# Patient Record
Sex: Female | Born: 1970 | Race: Black or African American | Hispanic: No | Marital: Single | Smoking: Never smoker
Health system: Southern US, Community
[De-identification: ages and names within clinical notes are randomized; demographics above are authoritative.]

## PROBLEM LIST (undated history)

## (undated) DIAGNOSIS — I10 Essential (primary) hypertension: Secondary | ICD-10-CM

## (undated) DIAGNOSIS — F419 Anxiety disorder, unspecified: Secondary | ICD-10-CM

## (undated) DIAGNOSIS — F410 Panic disorder [episodic paroxysmal anxiety] without agoraphobia: Secondary | ICD-10-CM

---

## 2002-01-04 ENCOUNTER — Other Ambulatory Visit: Admission: RE | Admit: 2002-01-04 | Discharge: 2002-01-04 | Payer: Self-pay | Admitting: Family Medicine

## 2009-09-12 ENCOUNTER — Emergency Department (HOSPITAL_BASED_OUTPATIENT_CLINIC_OR_DEPARTMENT_OTHER): Admission: EM | Admit: 2009-09-12 | Discharge: 2009-09-12 | Payer: Self-pay | Admitting: Emergency Medicine

## 2010-02-10 ENCOUNTER — Encounter
Admission: RE | Admit: 2010-02-10 | Discharge: 2010-02-10 | Payer: Self-pay | Source: Home / Self Care | Attending: Obstetrics and Gynecology | Admitting: Obstetrics and Gynecology

## 2010-05-02 LAB — DIFFERENTIAL
Basophils Absolute: 0.1 10*3/uL (ref 0.0–0.1)
Basophils Relative: 1 % (ref 0–1)
Eosinophils Absolute: 0.1 K/uL (ref 0.0–0.7)
Eosinophils Relative: 1 % (ref 0–5)
Lymphocytes Relative: 53 % — ABNORMAL HIGH (ref 12–46)
Lymphs Abs: 4 K/uL (ref 0.7–4.0)
Monocytes Absolute: 0.7 10*3/uL (ref 0.1–1.0)
Monocytes Relative: 9 % (ref 3–12)
Neutro Abs: 2.7 K/uL (ref 1.7–7.7)
Neutrophils Relative %: 35 % — ABNORMAL LOW (ref 43–77)

## 2010-05-02 LAB — BASIC METABOLIC PANEL WITH GFR
BUN: 14 mg/dL (ref 6–23)
CO2: 26 meq/L (ref 19–32)
Calcium: 9.1 mg/dL (ref 8.4–10.5)
Chloride: 102 meq/L (ref 96–112)
GFR calc non Af Amer: >60 mL/min (ref >60)
Glucose, Bld: 133 mg/dL — ABNORMAL HIGH (ref 70–99)
Potassium: 3.1 meq/L — ABNORMAL LOW (ref 3.5–5.1)
Sodium: 140 meq/L (ref 135–145)

## 2010-05-02 LAB — CBC
HCT: 36.3 % (ref 36.0–46.0)
Hemoglobin: 12.4 g/dL (ref 12.0–15.0)
MCH: 31.3 pg (ref 26.0–34.0)
MCHC: 34.1 g/dL (ref 30.0–36.0)
MCV: 91.8 fL (ref 78.0–100.0)
Platelets: 270 10*3/uL (ref 150–400)
RBC: 3.96 MIL/uL (ref 3.87–5.11)
RDW: 11.2 % — ABNORMAL LOW (ref 11.5–15.5)
WBC: 7.6 K/uL (ref 4.0–10.5)

## 2010-05-02 LAB — T3, FREE: T3, Free: 2.9 pg/mL (ref 2.3–4.2)

## 2010-05-02 LAB — BASIC METABOLIC PANEL
Creatinine, Ser: 0.8 mg/dL (ref 0.4–1.2)
GFR calc Af Amer: >60 mL/min (ref >60)

## 2010-05-02 LAB — POCT CARDIAC MARKERS
CKMB, poc: <1 ng/mL — ABNORMAL LOW (ref 1.0–8.0)
Myoglobin, poc: 47 ng/mL (ref 12–200)
Troponin i, poc: <0.05 ng/mL (ref 0.00–0.09)

## 2011-02-04 DIAGNOSIS — Z0000 Encounter for general adult medical examination without abnormal findings: Secondary | ICD-10-CM | POA: Insufficient documentation

## 2011-04-20 ENCOUNTER — Other Ambulatory Visit: Payer: Self-pay | Admitting: Obstetrics and Gynecology

## 2011-04-20 DIAGNOSIS — N6312 Unspecified lump in the right breast, upper inner quadrant: Secondary | ICD-10-CM

## 2011-04-28 ENCOUNTER — Ambulatory Visit
Admission: RE | Admit: 2011-04-28 | Discharge: 2011-04-28 | Disposition: A | Discharge status: Home / Self Care | Payer: 59 | Source: Ambulatory Visit | Attending: Obstetrics and Gynecology | Admitting: Obstetrics and Gynecology

## 2011-04-28 DIAGNOSIS — N6312 Unspecified lump in the right breast, upper inner quadrant: Secondary | ICD-10-CM

## 2012-03-28 ENCOUNTER — Other Ambulatory Visit: Payer: Self-pay | Admitting: Obstetrics and Gynecology

## 2012-03-28 DIAGNOSIS — Z1231 Encounter for screening mammogram for malignant neoplasm of breast: Secondary | ICD-10-CM

## 2012-05-04 ENCOUNTER — Ambulatory Visit
Admission: RE | Admit: 2012-05-04 | Discharge: 2012-05-04 | Disposition: A | Discharge status: Home / Self Care | Payer: 59 | Source: Ambulatory Visit | Attending: Obstetrics and Gynecology | Admitting: Obstetrics and Gynecology

## 2012-05-04 DIAGNOSIS — Z1231 Encounter for screening mammogram for malignant neoplasm of breast: Secondary | ICD-10-CM

## 2012-09-16 ENCOUNTER — Emergency Department (HOSPITAL_BASED_OUTPATIENT_CLINIC_OR_DEPARTMENT_OTHER)
Admission: EM | Admit: 2012-09-16 | Discharge: 2012-09-16 | Disposition: A | Discharge status: Home / Self Care | Payer: 59 | Attending: Emergency Medicine | Admitting: Emergency Medicine

## 2012-09-16 ENCOUNTER — Encounter (HOSPITAL_BASED_OUTPATIENT_CLINIC_OR_DEPARTMENT_OTHER): Payer: Self-pay | Admitting: *Deleted

## 2012-09-16 DIAGNOSIS — I10 Essential (primary) hypertension: Secondary | ICD-10-CM | POA: Insufficient documentation

## 2012-09-16 DIAGNOSIS — M542 Cervicalgia: Secondary | ICD-10-CM | POA: Insufficient documentation

## 2012-09-16 HISTORY — DX: Essential (primary) hypertension: I10

## 2012-09-16 MED ORDER — CYCLOBENZAPRINE HCL 5 MG PO TABS: 5.0000 mg | ORAL_TABLET | Freq: Two times a day (BID) | ORAL | Status: DC | PRN

## 2012-09-16 NOTE — ED Provider Notes (Signed)
  CSN: [id number]     Arrival date & time 09/16/12  0711 History     First MD Initiated Contact with Patient 09/16/12 [phone]     Chief Complaint  Patient presents with  . Neck Pain    Patient is a 42 y.o. female presenting with neck pain. The history is provided by the patient.  Neck Pain Pain location:  L side Quality:  Aching Pain radiates to:  L shoulder Pain severity:  Moderate Pain is:  Same all the time Onset quality:  Gradual Duration:  1 day Timing:  Intermittent Progression:  Unchanged Chronicity:  New Relieved by:  NSAIDs Exacerbated by: movement  Associated symptoms: no chest pain, no fever, no headaches, no paresis, no visual change and no weakness   Risk factors: no hx of spinal trauma   pt reports she has had left sided neck pain.  She reports it will radiate into left shoulder No arm/leg weakness No HA No visual change No difficulty swallowing No fever No h/o spine surgery  Past Medical History  Diagnosis Date  . Hypertension    History  Substance Use Topics  . Smoking status: Never Smoker   . Smokeless tobacco: Not on file  . Alcohol Use: No   OB History   Grav Para Term Preterm Abortions TAB SAB Ect Mult Living                 Review of Systems  Constitutional: Negative for fever.  HENT: Positive for neck pain. Negative for trouble swallowing.   Respiratory: Negative for shortness of breath.   Cardiovascular: Negative for chest pain.  Gastrointestinal: Negative for vomiting.  Musculoskeletal: Negative for back pain.  Neurological: Negative for weakness and headaches.  All other systems reviewed and are negative.    Allergies  Review of patient's allergies indicates no known allergies.  Home Medications   Current Outpatient Rx  Name  Route  Sig  Dispense  Refill  . olmesartan-hydrochlorothiazide (BENICAR HCT) 20-12.5 MG per tablet   Oral   Take 1 tablet by mouth daily.         . cyclobenzaprine (FLEXERIL) 5 MG tablet   Oral   Take  1 tablet (5 mg total) by mouth 2 (two) times daily as needed for muscle spasms.   10 tablet   0    BP 112/76  Pulse 87  Temp(Src) 98.1 F (36.7 C) (Oral)  Resp 18  Ht 5\' 6"  (1.676 m)  Wt 110 lb (49.896 kg)  BMI 17.76 kg/m2  SpO2 100% Physical Exam CONSTITUTIONAL: Well developed/well nourished HEAD: Normocephalic/atraumatic EYES: EOMI/PERRL ENMT: Mucous membranes moist NECK: supple no meningeal signs, no bruits noted SPINE:entire spine nontender. Left cervical paraspinal tenderness.  No bruising/crepitance/stepoffs noted to spine CV: S1/S2 noted, no murmurs/rubs/gallops noted LUNGS: Lungs are clear to auscultation bilaterally, no apparent distress ABDOMEN: soft, nontender, no rebound or guarding GU:no cva tenderness NEURO: Pt is awake/alert, moves all extremitiesx4 Equal power with wrist flex/extension, elbow flex/extension, and equal power with shoulder abduction/adduction.  No focal sensory deficit is noted in either UE.   Equal biceps/brachioradial reflex in bilateral UE EXTREMITIES: pulses normal, full ROM SKIN: warm, color normal PSYCH: no abnormalities of mood noted  ED Course   Procedures  1. Neck pain on left side     MDM  Nursing notes including past medical history and social history reviewed and considered in documentation   Joya Gaskins, MD 09/16/12 [phone]

## 2012-09-16 NOTE — ED Notes (Signed)
Patient with left sided neck pain. Not sure if she injured it while at work or slept on it wrong.  Patient took ibuprofen at 3am and it helped a little with pain.

## 2013-04-27 ENCOUNTER — Other Ambulatory Visit: Payer: Self-pay

## 2013-04-27 DIAGNOSIS — Z1231 Encounter for screening mammogram for malignant neoplasm of breast: Secondary | ICD-10-CM

## 2013-05-17 ENCOUNTER — Ambulatory Visit
Admission: RE | Admit: 2013-05-17 | Discharge: 2013-05-17 | Disposition: A | Discharge status: Home / Self Care | Payer: 59 | Source: Ambulatory Visit

## 2013-05-17 DIAGNOSIS — Z1231 Encounter for screening mammogram for malignant neoplasm of breast: Secondary | ICD-10-CM

## 2013-07-31 DIAGNOSIS — F411 Generalized anxiety disorder: Secondary | ICD-10-CM | POA: Insufficient documentation

## 2013-09-13 DIAGNOSIS — K219 Gastro-esophageal reflux disease without esophagitis: Secondary | ICD-10-CM | POA: Insufficient documentation

## 2013-12-02 ENCOUNTER — Encounter (HOSPITAL_BASED_OUTPATIENT_CLINIC_OR_DEPARTMENT_OTHER): Payer: Self-pay | Admitting: Emergency Medicine

## 2013-12-02 ENCOUNTER — Emergency Department (HOSPITAL_BASED_OUTPATIENT_CLINIC_OR_DEPARTMENT_OTHER): Payer: 59

## 2013-12-02 ENCOUNTER — Emergency Department (HOSPITAL_BASED_OUTPATIENT_CLINIC_OR_DEPARTMENT_OTHER)
Admission: EM | Admit: 2013-12-02 | Discharge: 2013-12-02 | Disposition: A | Discharge status: Home / Self Care | Payer: 59 | Attending: Emergency Medicine | Admitting: Emergency Medicine

## 2013-12-02 DIAGNOSIS — R000 Tachycardia, unspecified: Secondary | ICD-10-CM | POA: Insufficient documentation

## 2013-12-02 DIAGNOSIS — R002 Palpitations: Secondary | ICD-10-CM | POA: Diagnosis not present

## 2013-12-02 DIAGNOSIS — Z79899 Other long term (current) drug therapy: Secondary | ICD-10-CM | POA: Diagnosis not present

## 2013-12-02 DIAGNOSIS — I10 Essential (primary) hypertension: Secondary | ICD-10-CM | POA: Diagnosis not present

## 2013-12-02 LAB — BASIC METABOLIC PANEL
ANION GAP: 14 (ref 5–15)
BUN: 10 mg/dL (ref 6–23)
CHLORIDE: 103 meq/L (ref 96–112)
CO2: 24 meq/L (ref 19–32)
Calcium: 9.4 mg/dL (ref 8.4–10.5)
Creatinine, Ser: 0.7 mg/dL (ref 0.50–1.10)
Glucose, Bld: 97 mg/dL (ref 70–99)
Potassium: 3.3 mEq/L — ABNORMAL LOW (ref 3.7–5.3)
Sodium: 141 mEq/L (ref 137–147)

## 2013-12-02 LAB — CBC
HEMATOCRIT: 32.2 % — AB (ref 36.0–46.0)
Hemoglobin: 11.1 g/dL — ABNORMAL LOW (ref 12.0–15.0)
MCH: 30.8 pg (ref 26.0–34.0)
MCHC: 34.5 g/dL (ref 30.0–36.0)
MCV: 89.4 fL (ref 78.0–100.0)
Platelets: 278 10*3/uL (ref 150–400)
RBC: 3.6 MIL/uL — AB (ref 3.87–5.11)
RDW: 11.1 % — ABNORMAL LOW (ref 11.5–15.5)
WBC: 5.7 10*3/uL (ref 4.0–10.5)

## 2013-12-02 LAB — D-DIMER, QUANTITATIVE (NOT AT ARMC)

## 2013-12-02 LAB — TROPONIN I

## 2013-12-02 NOTE — ED Provider Notes (Signed)
CSN: [id number]     Arrival date & time 12/02/13  1853 History   First MD Initiated Contact with Patient 12/02/13 2020     Chief Complaint  Patient presents with  . Irregular Heart Beat     (Consider location/radiation/quality/duration/timing/severity/associated sxs/prior Treatment) HPI Comments: This is a 43 year old female with a past medical history of hypertension who presents to the emergency department complaining of palpitations x1 day. Patient reports she was on a bus [place] into town around 9:00 AM when she felt "fluttering" in her chest. Sensation lasted a few minutes and subsided on its own, and a few hours later returned again. It has been intermittent throughout the day. Nothing in specific makes it come or go. Denies chest pain or shortness of breath. She does report she has GERD and tends to burp frequently, however states it is in no relation to palpitations. Denies nausea, vomiting, lightheadedness or dizziness. States she had a rapid heart rate in the past and was told she had anxiety. States she may have been anxious on the bus, however so significant anxiety. She recently had a stress test which was normal.  The history is provided by the patient.    Past Medical History  Diagnosis Date  . Hypertension    History reviewed. No pertinent past surgical history. No family history on file. History  Substance Use Topics  . Smoking status: Never Smoker   . Smokeless tobacco: Not on file  . Alcohol Use: No   OB History   Grav Para Term Preterm Abortions TAB SAB Ect Mult Living                 Review of Systems  10 Systems reviewed and are negative for acute change except as noted in the HPI.   Allergies  Review of patient's allergies indicates no known allergies.  Home Medications   Prior to Admission medications   Medication Sig Start Date End Date Taking? Authorizing Provider  ALPRAZolam Prudy Feeler(XANAX) 0.5 MG tablet Take 0.5 mg by mouth at bedtime as  needed for anxiety.   Yes Historical Provider, MD  cyclobenzaprine (FLEXERIL) 5 MG tablet Take 1 tablet (5 mg total) by mouth 2 (two) times daily as needed for muscle spasms. 09/16/12   Joya Gaskinsonald W Wickline, MD  olmesartan-hydrochlorothiazide (BENICAR HCT) 20-12.5 MG per tablet Take 1 tablet by mouth daily.    Historical Provider, MD   BP 123/73  Pulse 100  Temp(Src) 98.7 F (37.1 C) (Oral)  Resp 18  Ht 5\' 7"  (1.702 m)  Wt 119 lb (53.978 kg)  BMI 18.63 kg/m2  SpO2 100%  LMP 11/15/2013 Physical Exam  Nursing note and vitals reviewed. Constitutional: She is oriented to person, place, and time. She appears well-developed and well-nourished. No distress.  HENT:  Head: Normocephalic and atraumatic.  Mouth/Throat: Oropharynx is clear and moist.  Eyes: Conjunctivae and EOM are normal. Pupils are equal, round, and reactive to light.  Neck: Normal range of motion. Neck supple. No JVD present.  Cardiovascular: Regular rhythm, normal heart sounds and intact distal pulses.   Tachycardia. No extremity edema.  Pulmonary/Chest: Effort normal and breath sounds normal. No respiratory distress.  Abdominal: Soft. Bowel sounds are normal. There is no tenderness.  Musculoskeletal: Normal range of motion. She exhibits no edema.  Neurological: She is alert and oriented to person, place, and time. She has normal strength. No sensory deficit.  Speech fluent, goal oriented. Moves limbs without ataxia. Equal grip strength bilateral.  Skin: Skin  is warm and dry. She is not diaphoretic.  Psychiatric: She has a normal mood and affect. Her behavior is normal.    ED Course  Procedures (including critical care time) Labs Review Labs Reviewed  CBC - Abnormal; Notable for the following:    RBC 3.60 (*)    Hemoglobin 11.1 (*)    HCT 32.2 (*)    RDW 11.1 (*)    All other components within normal limits  BASIC METABOLIC PANEL - Abnormal; Notable for the following:    Potassium 3.3 (*)    All other components  within normal limits  TROPONIN I  D-DIMER, QUANTITATIVE    Imaging Review Dg Chest 2 View  12/02/2013   CLINICAL DATA:  Palpitations.  Duration of symptoms today.  EXAM: CHEST  2 VIEW  COMPARISON:  None.  FINDINGS: Artifact overlies the chest. Heart size is normal. Mediastinal shadows are normal. The lungs are clear. No effusions. No bony abnormalities. Pulmonary vascularity is normal.  IMPRESSION: Normal chest   Electronically Signed   By: Paulina FusiMark  Shogry M.D.   On: 12/02/2013 21:20     EKG Interpretation   Date/Time:  Sunday December 02 2013 19:02:43 EDT Ventricular Rate:  108 PR Interval:  142 QRS Duration: 78 QT Interval:  316 QTC Calculation: 423 R Axis:   64 Text Interpretation:  Sinus tachycardia Right atrial enlargement  Nonspecific ST and T wave abnormality Abnormal ECG agree. consistent with  old. Confirmed by Donnald GarrePfeiffer, MD, Lebron ConnersMarcy [phone]([id number]) on 12/02/2013 8:58:46 PM      MDM   Final diagnoses:  Palpitations   Patient presenting with palpitations. She is nontoxic appearing and in no apparent distress. Afebrile, tachycardic, vitals otherwise stable. No other symptoms present. Cannot explain tachycardia at this time. Cannot PERC negative. Labs pending. EKG sinus tachycardia with right atrial enlargement, nonspecific ST and T wave abnormality. 11:04 PM Troponin negative. D-dimer WNL. Low risk PE- no exogenous estrogen, no recent surgeries or long travel. CXR normal. Low risk HEART score II. HR 96-98. Palpitations may be from anxiety. I advised pt to f/u with her PCP regarding palpitations. Stable for d/c. Return precautions given. Patient states understanding of treatment care plan and is agreeable.  Kathrynn SpeedRobyn M Janet Humphreys, PA-C 12/02/13 2306

## 2013-12-02 NOTE — ED Notes (Signed)
Patient states that all day she has felt heart flutters or skipped beats. Denies any other symptoms.

## 2013-12-02 NOTE — ED Provider Notes (Signed)
Medical screening examination/treatment/procedure(s) were performed by non-physician practitioner and as supervising physician I was immediately available for consultation/collaboration.   EKG Interpretation   Date/Time:  Sunday December 02 2013 19:02:43 EDT Ventricular Rate:  108 PR Interval:  142 QRS Duration: 78 QT Interval:  316 QTC Calculation: 423 R Axis:   64 Text Interpretation:  Sinus tachycardia Right atrial enlargement  Nonspecific ST and T wave abnormality Abnormal ECG agree. consistent with  old. Confirmed by Donnald GarrePfeiffer, MD, Lebron ConnersMarcy [phone]([id number]) on 12/02/2013 8:58:46 PM       Janet BarretteMarcy Mirinda Monte, MD 12/02/13 2358

## 2013-12-02 NOTE — Discharge Instructions (Signed)
Follow up with your primary care doctor.  Palpitations A palpitation is the feeling that your heartbeat is irregular or is faster than normal. It may feel like your heart is fluttering or skipping a beat. Palpitations are usually not a serious problem. However, in some cases, you may need further medical evaluation. CAUSES  Palpitations can be caused by:  Smoking.  Caffeine or other stimulants, such as diet pills or energy drinks.  Alcohol.  Stress and anxiety.  Strenuous physical activity.  Fatigue.  Certain medicines.  Heart disease, especially if you have a history of irregular heart rhythms (arrhythmias), such as atrial fibrillation, atrial flutter, or supraventricular tachycardia.  An improperly working pacemaker or defibrillator. DIAGNOSIS  To find the cause of your palpitations, your health care provider will take your medical history and perform a physical exam. Your health care provider may also have you take a test called an ambulatory electrocardiogram (ECG). An ECG records your heartbeat patterns over a 24-hour period. You may also have other tests, such as:  Transthoracic echocardiogram (TTE). During echocardiography, sound waves are used to evaluate how blood flows through your heart.  Transesophageal echocardiogram (TEE).  Cardiac monitoring. This allows your health care provider to monitor your heart rate and rhythm in real time.  Holter monitor. This is a portable device that records your heartbeat and can help diagnose heart arrhythmias. It allows your health care provider to track your heart activity for several days, if needed.  Stress tests by exercise or by giving medicine that makes the heart beat faster. TREATMENT  Treatment of palpitations depends on the cause of your symptoms and can vary greatly. Most cases of palpitations do not require any treatment other than time, relaxation, and monitoring your symptoms. Other causes, such as atrial fibrillation,  atrial flutter, or supraventricular tachycardia, usually require further treatment. HOME CARE INSTRUCTIONS   Avoid:  Caffeinated coffee, tea, soft drinks, diet pills, and energy drinks.  Chocolate.  Alcohol.  Stop smoking if you smoke.  Reduce your stress and anxiety. Things that can help you relax include:  A method of controlling things in your body, such as your heartbeats, with your mind (biofeedback).  Yoga.  Meditation.  Physical activity such as swimming, jogging, or walking.  Get plenty of rest and sleep. SEEK MEDICAL CARE IF:   You continue to have a fast or irregular heartbeat beyond 24 hours.  Your palpitations occur more often. SEEK IMMEDIATE MEDICAL CARE IF:  You have chest pain or shortness of breath.  You have a severe headache.  You feel dizzy or you faint. MAKE SURE YOU:  Understand these instructions.  Will watch your condition.  Will get help right away if you are not doing well or get worse. Document Released: 01/30/2000 Document Revised: 02/06/2013 Document Reviewed: 04/02/2011 [facility] Patient Information 2015 [place], [place]. This information is not intended to replace advice given to you by your health care provider. Make sure you discuss any questions you have with your health care provider.

## 2013-12-19 ENCOUNTER — Encounter (HOSPITAL_BASED_OUTPATIENT_CLINIC_OR_DEPARTMENT_OTHER): Payer: Self-pay

## 2013-12-19 ENCOUNTER — Emergency Department (HOSPITAL_BASED_OUTPATIENT_CLINIC_OR_DEPARTMENT_OTHER)
Admission: EM | Admit: 2013-12-19 | Discharge: 2013-12-19 | Disposition: A | Discharge status: Home / Self Care | Payer: 59 | Attending: Emergency Medicine | Admitting: Emergency Medicine

## 2013-12-19 ENCOUNTER — Emergency Department (HOSPITAL_BASED_OUTPATIENT_CLINIC_OR_DEPARTMENT_OTHER): Payer: 59

## 2013-12-19 DIAGNOSIS — Z79899 Other long term (current) drug therapy: Secondary | ICD-10-CM | POA: Diagnosis not present

## 2013-12-19 DIAGNOSIS — I10 Essential (primary) hypertension: Secondary | ICD-10-CM | POA: Diagnosis not present

## 2013-12-19 DIAGNOSIS — R0789 Other chest pain: Secondary | ICD-10-CM | POA: Diagnosis not present

## 2013-12-19 DIAGNOSIS — F410 Panic disorder [episodic paroxysmal anxiety] without agoraphobia: Secondary | ICD-10-CM | POA: Insufficient documentation

## 2013-12-19 DIAGNOSIS — R079 Chest pain, unspecified: Secondary | ICD-10-CM | POA: Diagnosis present

## 2013-12-19 HISTORY — DX: Panic disorder (episodic paroxysmal anxiety): F41.0

## 2013-12-19 HISTORY — DX: Anxiety disorder, unspecified: F41.9

## 2013-12-19 LAB — BASIC METABOLIC PANEL
Anion gap: 11 (ref 5–15)
BUN: 14 mg/dL (ref 6–23)
CO2: 27 meq/L (ref 19–32)
CREATININE: 0.8 mg/dL (ref 0.50–1.10)
Calcium: 9.9 mg/dL (ref 8.4–10.5)
Chloride: 103 mEq/L (ref 96–112)
GFR calc Af Amer: >90 mL/min (ref >90)
GFR, EST NON AFRICAN AMERICAN: 90 mL/min — AB (ref >90)
GLUCOSE: 88 mg/dL (ref 70–99)
Potassium: 3.6 mEq/L — ABNORMAL LOW (ref 3.7–5.3)
SODIUM: 141 meq/L (ref 137–147)

## 2013-12-19 LAB — CBC WITH DIFFERENTIAL/PLATELET
BASOS ABS: 0 10*3/uL (ref 0.0–0.1)
Basophils Relative: 1 % (ref 0–1)
EOS ABS: 0.1 10*3/uL (ref 0.0–0.7)
EOS PCT: 1 % (ref 0–5)
HEMATOCRIT: 34.8 % — AB (ref 36.0–46.0)
Hemoglobin: 11.8 g/dL — ABNORMAL LOW (ref 12.0–15.0)
LYMPHS PCT: 39 % (ref 12–46)
Lymphs Abs: 2 10*3/uL (ref 0.7–4.0)
MCH: 31 pg (ref 26.0–34.0)
MCHC: 33.9 g/dL (ref 30.0–36.0)
MCV: 91.3 fL (ref 78.0–100.0)
MONO ABS: 0.5 10*3/uL (ref 0.1–1.0)
Monocytes Relative: 11 % (ref 3–12)
Neutro Abs: 2.5 10*3/uL (ref 1.7–7.7)
Neutrophils Relative %: 48 % (ref 43–77)
PLATELETS: 267 10*3/uL (ref 150–400)
RBC: 3.81 MIL/uL — ABNORMAL LOW (ref 3.87–5.11)
RDW: 11.4 % — AB (ref 11.5–15.5)
WBC: 5.1 10*3/uL (ref 4.0–10.5)

## 2013-12-19 LAB — D-DIMER, QUANTITATIVE (NOT AT ARMC)

## 2013-12-19 LAB — TROPONIN I: Troponin I: <0.3 ng/mL (ref <0.30)

## 2013-12-19 NOTE — ED Notes (Signed)
NP at bedside.

## 2013-12-19 NOTE — ED Notes (Signed)
C/o left upper chest, axilla pain started this am

## 2013-12-19 NOTE — ED Provider Notes (Signed)
CSN: [id number]     Arrival date & time 12/19/13  1539 History   First MD Initiated Contact with Patient 12/19/13 1555     Chief Complaint  Patient presents with  . Chest Pain     (Consider location/radiation/quality/duration/timing/severity/associated sxs/prior Treatment) HPI Comments: Pt comes in today with c/o pain in her left chest wall near her axilla. Pain is worse with breathing. Pain has been intermittent for the last 8 hours. No fever, cough, nausea, vomiting, diaphoresis or sob. States that she has history of similar symptoms and has had a stress test recently  That was normal. Pt states that she is not having pain at this time. She states that she felt sob on the way here but she states that it was because she became very anxious about the cause of the symptoms.  The history is provided by the patient. No language interpreter was used.    Past Medical History  Diagnosis Date  . Hypertension   . Anxiety   . Panic attack    History reviewed. No pertinent past surgical history. No family history on file. History  Substance Use Topics  . Smoking status: Never Smoker   . Smokeless tobacco: Not on file  . Alcohol Use: No   OB History    No data available     Review of Systems  All other systems reviewed and are negative.     Allergies  Review of patient's allergies indicates no known allergies.  Home Medications   Prior to Admission medications   Medication Sig Start Date End Date Taking? Authorizing Provider  ALPRAZolam Prudy Feeler(XANAX) 0.5 MG tablet Take 0.5 mg by mouth at bedtime as needed for anxiety.    Historical Provider, MD  cyclobenzaprine (FLEXERIL) 5 MG tablet Take 1 tablet (5 mg total) by mouth 2 (two) times daily as needed for muscle spasms. 09/16/12   Joya Gaskinsonald W Wickline, MD  olmesartan-hydrochlorothiazide (BENICAR HCT) 20-12.5 MG per tablet Take 1 tablet by mouth daily.    Historical Provider, MD   BP 122/77 mmHg  Pulse 95  Temp(Src) 98.6 F (37 C) (Oral)   Resp 22  SpO2 99%  LMP 11/29/2013 Physical Exam  Constitutional: She is oriented to person, place, and time. She appears well-developed and well-nourished.  HENT:  Head: Normocephalic and atraumatic.  Cardiovascular: Normal rate and regular rhythm.   Pulmonary/Chest: Effort normal and breath sounds normal.  Reproducible point tenderness in the left chest  Abdominal: Soft. Bowel sounds are normal. There is no tenderness.  Musculoskeletal: Normal range of motion.  Neurological: She is alert and oriented to person, place, and time.  Skin: Skin is warm and dry.  Psychiatric: She has a normal mood and affect.  Nursing note and vitals reviewed.   ED Course  Procedures (including critical care time) Labs Review Labs Reviewed  BASIC METABOLIC PANEL - Abnormal; Notable for the following:    Potassium 3.6 (*)    GFR calc non Af Amer 90 (*)    All other components within normal limits  CBC WITH DIFFERENTIAL - Abnormal; Notable for the following:    RBC 3.81 (*)    Hemoglobin 11.8 (*)    HCT 34.8 (*)    RDW 11.4 (*)    All other components within normal limits  TROPONIN I  D-DIMER, QUANTITATIVE    Imaging Review Dg Chest 2 View  12/19/2013   CLINICAL DATA:  Left-sided chest pain, tightness. Shortness of breath. History of hypertension.  EXAM: CHEST  2  VIEW  COMPARISON:  12/02/2013  FINDINGS: The heart size and mediastinal contours are within normal limits. Both lungs are clear. The visualized skeletal structures are unremarkable.  IMPRESSION: No active cardiopulmonary disease.   Electronically Signed   By: Charlett NoseKevin  Dover M.D.   On: 12/19/2013 16:24     EKG Interpretation   Date/Time:  Wednesday December 19 2013 15:49:43 EST Ventricular Rate:  94 PR Interval:  146 QRS Duration: 80 QT Interval:  368 QTC Calculation: 460 R Axis:   59 Text Interpretation:  Normal sinus rhythm Normal ECG No significant change  since last tracing Confirmed by YAO  MD, DAVID ([id number]) on 12/19/2013   5:42:04 PM      MDM   Final diagnoses:  Other chest pain   Doubt acs. tropinin negative. Discussed follow up with pcp to discuss gi issues being a possibility. Pt okay with plan     Teressa LowerVrinda Loyal Holzheimer, NP 12/19/13 1746  Richardean Canalavid H Yao, MD 12/19/13 [phone]

## 2013-12-19 NOTE — Discharge Instructions (Signed)
As discussed I would suggest you follow up with your doctor and discussed further follow up Chest Pain (Nonspecific) It is often hard to give a diagnosis for the cause of chest pain. There is always a chance that your pain could be related to something serious, such as a heart attack or a blood clot in the lungs. You need to follow up with your doctor. HOME CARE  If antibiotic medicine was given, take it as directed by your doctor. Finish the medicine even if you start to feel better.  For the next few days, avoid activities that bring on chest pain. Continue physical activities as told by your doctor.  Do not use any tobacco products. This includes cigarettes, chewing tobacco, and e-cigarettes.  Avoid drinking alcohol.  Only take medicine as told by your doctor.  Follow your doctor's suggestions for more testing if your chest pain does not go away.  Keep all doctor visits you made. GET HELP IF:  Your chest pain does not go away, even after treatment.  You have a rash with blisters on your chest.  You have a fever. GET HELP RIGHT AWAY IF:   You have more pain or pain that spreads to your arm, neck, jaw, back, or belly (abdomen).  You have shortness of breath.  You cough more than usual or cough up blood.  You have very bad back or belly pain.  You feel sick to your stomach (nauseous) or throw up (vomit).  You have very bad weakness.  You pass out (faint).  You have chills. This is an emergency. Do not wait to see if the problems will go away. Call your local emergency services (911 in [place] Do not drive yourself to the hospital. MAKE SURE YOU:   Understand these instructions.  Will watch your condition.  Will get help right away if you are not doing well or get worse. Document Released: 07/21/2007 Document Revised: 02/06/2013 Document Reviewed: 07/21/2007 [facility] Patient Information 2015 [place], [place]. This information is not intended to replace advice given to  you by your health care provider. Make sure you discuss any questions you have with your health care provider.

## 2013-12-20 DIAGNOSIS — J309 Allergic rhinitis, unspecified: Secondary | ICD-10-CM | POA: Insufficient documentation

## 2013-12-20 DIAGNOSIS — F4322 Adjustment disorder with anxiety: Secondary | ICD-10-CM | POA: Insufficient documentation

## 2013-12-20 DIAGNOSIS — I10 Essential (primary) hypertension: Secondary | ICD-10-CM | POA: Insufficient documentation

## 2014-03-06 DIAGNOSIS — Z9289 Personal history of other medical treatment: Secondary | ICD-10-CM | POA: Insufficient documentation

## 2014-03-07 ENCOUNTER — Other Ambulatory Visit: Payer: Self-pay

## 2014-03-07 DIAGNOSIS — Z1231 Encounter for screening mammogram for malignant neoplasm of breast: Secondary | ICD-10-CM

## 2014-05-20 ENCOUNTER — Ambulatory Visit: Payer: Self-pay

## 2014-06-21 ENCOUNTER — Ambulatory Visit: Payer: Self-pay

## 2014-07-22 ENCOUNTER — Ambulatory Visit
Admission: RE | Admit: 2014-07-22 | Discharge: 2014-07-22 | Disposition: A | Discharge status: Home / Self Care | Payer: 59 | Source: Ambulatory Visit

## 2014-07-22 DIAGNOSIS — Z1231 Encounter for screening mammogram for malignant neoplasm of breast: Secondary | ICD-10-CM

## 2015-05-09 ENCOUNTER — Other Ambulatory Visit: Payer: Self-pay

## 2015-05-09 DIAGNOSIS — Z1231 Encounter for screening mammogram for malignant neoplasm of breast: Secondary | ICD-10-CM

## 2015-09-15 ENCOUNTER — Ambulatory Visit
Admission: RE | Admit: 2015-09-15 | Discharge: 2015-09-15 | Disposition: A | Discharge status: Home / Self Care | Payer: BLUE CROSS/BLUE SHIELD | Source: Ambulatory Visit

## 2015-09-15 ENCOUNTER — Encounter: Payer: Self-pay | Admitting: Radiology

## 2015-09-15 DIAGNOSIS — Z1231 Encounter for screening mammogram for malignant neoplasm of breast: Secondary | ICD-10-CM

## 2016-07-13 ENCOUNTER — Other Ambulatory Visit: Payer: Self-pay | Admitting: Family Medicine

## 2016-07-13 DIAGNOSIS — Z1231 Encounter for screening mammogram for malignant neoplasm of breast: Secondary | ICD-10-CM

## 2016-09-15 ENCOUNTER — Ambulatory Visit
Admission: RE | Admit: 2016-09-15 | Discharge: 2016-09-15 | Disposition: A | Discharge status: Home / Self Care | Payer: BLUE CROSS/BLUE SHIELD | Source: Ambulatory Visit | Attending: Family Medicine | Admitting: Family Medicine

## 2016-09-15 ENCOUNTER — Encounter (INDEPENDENT_AMBULATORY_CARE_PROVIDER_SITE_OTHER): Payer: Self-pay

## 2016-09-15 DIAGNOSIS — Z1231 Encounter for screening mammogram for malignant neoplasm of breast: Secondary | ICD-10-CM

## 2017-02-22 DIAGNOSIS — J301 Allergic rhinitis due to pollen: Secondary | ICD-10-CM | POA: Insufficient documentation

## 2017-07-15 ENCOUNTER — Other Ambulatory Visit: Payer: Self-pay | Admitting: Family Medicine

## 2017-07-15 DIAGNOSIS — Z1231 Encounter for screening mammogram for malignant neoplasm of breast: Secondary | ICD-10-CM

## 2017-09-16 ENCOUNTER — Ambulatory Visit
Admission: RE | Admit: 2017-09-16 | Discharge: 2017-09-16 | Disposition: A | Discharge status: Home / Self Care | Payer: BLUE CROSS/BLUE SHIELD | Source: Ambulatory Visit | Attending: Family Medicine | Admitting: Family Medicine

## 2017-09-16 DIAGNOSIS — Z1231 Encounter for screening mammogram for malignant neoplasm of breast: Secondary | ICD-10-CM

## 2018-07-08 ENCOUNTER — Encounter (HOSPITAL_COMMUNITY): Payer: Self-pay | Admitting: Emergency Medicine

## 2018-07-08 ENCOUNTER — Other Ambulatory Visit: Payer: Self-pay

## 2018-07-08 ENCOUNTER — Emergency Department (HOSPITAL_COMMUNITY)
Admission: EM | Admit: 2018-07-08 | Discharge: 2018-07-08 | Disposition: A | Discharge status: Home / Self Care | Payer: BLUE CROSS/BLUE SHIELD | Attending: Emergency Medicine | Admitting: Emergency Medicine

## 2018-07-08 DIAGNOSIS — Z79899 Other long term (current) drug therapy: Secondary | ICD-10-CM | POA: Insufficient documentation

## 2018-07-08 DIAGNOSIS — R1013 Epigastric pain: Secondary | ICD-10-CM | POA: Diagnosis present

## 2018-07-08 DIAGNOSIS — R682 Dry mouth, unspecified: Secondary | ICD-10-CM | POA: Diagnosis not present

## 2018-07-08 DIAGNOSIS — R946 Abnormal results of thyroid function studies: Secondary | ICD-10-CM | POA: Insufficient documentation

## 2018-07-08 DIAGNOSIS — I10 Essential (primary) hypertension: Secondary | ICD-10-CM | POA: Diagnosis not present

## 2018-07-08 DIAGNOSIS — R7989 Other specified abnormal findings of blood chemistry: Secondary | ICD-10-CM

## 2018-07-08 DIAGNOSIS — K219 Gastro-esophageal reflux disease without esophagitis: Secondary | ICD-10-CM | POA: Diagnosis not present

## 2018-07-08 LAB — CBC
HCT: 38 % (ref 36.0–46.0)
Hemoglobin: 12.5 g/dL (ref 12.0–15.0)
MCH: 30.2 pg (ref 26.0–34.0)
MCHC: 32.9 g/dL (ref 30.0–36.0)
MCV: 91.8 fL (ref 80.0–100.0)
Platelets: 262 10*3/uL (ref 150–400)
RBC: 4.14 MIL/uL (ref 3.87–5.11)
RDW: 11.2 % — ABNORMAL LOW (ref 11.5–15.5)
WBC: 4.7 10*3/uL (ref 4.0–10.5)
nRBC: 0 % (ref 0.0–0.2)

## 2018-07-08 LAB — COMPREHENSIVE METABOLIC PANEL
ALT: 16 U/L (ref 0–44)
AST: 18 U/L (ref 15–41)
Albumin: 4.5 g/dL (ref 3.5–5.0)
Alkaline Phosphatase: 94 U/L (ref 38–126)
Anion gap: 8 (ref 5–15)
BUN: 18 mg/dL (ref 6–20)
CO2: 28 mmol/L (ref 22–32)
Calcium: 9.4 mg/dL (ref 8.9–10.3)
Chloride: 104 mmol/L (ref 98–111)
Creatinine, Ser: 0.88 mg/dL (ref 0.44–1.00)
GFR calc Af Amer: >60 mL/min (ref >60)
GFR calc non Af Amer: >60 mL/min (ref >60)
Glucose, Bld: 108 mg/dL — ABNORMAL HIGH (ref 70–99)
Potassium: 3.7 mmol/L (ref 3.5–5.1)
Sodium: 140 mmol/L (ref 135–145)
Total Bilirubin: 0.6 mg/dL (ref 0.3–1.2)
Total Protein: 8.5 g/dL — ABNORMAL HIGH (ref 6.5–8.1)

## 2018-07-08 LAB — LIPASE, BLOOD: Lipase: 29 U/L (ref 11–51)

## 2018-07-08 LAB — URINALYSIS, ROUTINE W REFLEX MICROSCOPIC
Bilirubin Urine: NEGATIVE
Glucose, UA: NEGATIVE mg/dL
Hgb urine dipstick: NEGATIVE
Ketones, ur: NEGATIVE mg/dL
Leukocytes,Ua: NEGATIVE
Nitrite: NEGATIVE
Protein, ur: NEGATIVE mg/dL
Specific Gravity, Urine: 1.006 (ref 1.005–1.030)
pH: 6 (ref 5.0–8.0)

## 2018-07-08 LAB — I-STAT BETA HCG BLOOD, ED (MC, WL, AP ONLY): I-stat hCG, quantitative: <5 m[IU]/mL (ref <5)

## 2018-07-08 LAB — TSH: TSH: 4.771 u[IU]/mL — ABNORMAL HIGH (ref 0.350–4.500)

## 2018-07-08 MED ORDER — SODIUM CHLORIDE 0.9% FLUSH: 3.0000 mL | Freq: Once | INTRAVENOUS | Status: DC

## 2018-07-08 MED ORDER — OMEPRAZOLE 20 MG PO CPDR: 20.0000 mg | DELAYED_RELEASE_CAPSULE | Freq: Every day | ORAL | 0 refills | Status: AC

## 2018-07-08 NOTE — ED Triage Notes (Signed)
Patient is feeling burning in her abdomen, dry mouth, pulse high, and blood pressure high. Patient states she does not feel good.

## 2018-07-08 NOTE — ED Provider Notes (Signed)
[facility]-EMERGENCY DEPT Provider Note   CSN: [id number] Arrival date & time: 07/08/18  0240    History   Chief Complaint Chief Complaint  Patient presents with  . Hypertension  . Abdominal Pain    HPI Janet Perez is a 48 y.o. female.     Patient presents to the emergency department with a chief complaint of multiple complaints.  She states that this week she has been having high blood pressure, dry mouth, symptoms of racing heart, but without palpitations.  He also reports that she has had some acid reflux and pain in her epigastrium.  She also states that she had a cramp in her foot earlier tonight, but this is resolved.  The history is provided by the patient. No language interpreter was used.    Past Medical History:  Diagnosis Date  . Anxiety   . Hypertension   . Panic attack     There are no active problems to display for this patient.   History reviewed. No pertinent surgical history.   OB History   No obstetric history on file.      Home Medications    Prior to Admission medications   Medication Sig Start Date End Date Taking? Authorizing Provider  ALPRAZolam Prudy Feeler(XANAX) 0.5 MG tablet Take 0.5 mg by mouth at bedtime as needed for anxiety.    [provider]  cyclobenzaprine (FLEXERIL) 5 MG tablet Take 1 tablet (5 mg total) by mouth 2 (two) times daily as needed for muscle spasms. 09/16/12   Zadie RhineWickline, Donald, MD  olmesartan-hydrochlorothiazide (BENICAR HCT) 20-12.5 MG per tablet Take 1 tablet by mouth daily.    [provider]    Family History Family History  Problem Relation Age of Onset  . Breast cancer Maternal Aunt     Social History Social History   Tobacco Use  . Smoking status: Never Smoker  . Smokeless tobacco: Never Used  Substance Use Topics  . Alcohol use: No  . Drug use: No     Allergies   Amoxicillin-pot clavulanate; Other; and Amoxicillin   Review of Systems Review of Systems  All  other systems reviewed and are negative.    Physical Exam Updated Vital Signs BP 133/85 (BP Location: Left Arm)   Pulse 98   Temp 99 F (37.2 C) (Oral)   Resp 15   Ht 5\' 9"  (1.753 m)   Wt 56.7 kg   LMP 11/29/2013   SpO2 100%   BMI 18.46 kg/m   Physical Exam Vitals signs and nursing note reviewed.  Constitutional:      General: She is not in acute distress.    Appearance: She is well-developed.  HENT:     Head: Normocephalic and atraumatic.     Mouth/Throat:     Comments: No palpable thyroid nodules Eyes:     Conjunctiva/sclera: Conjunctivae normal.  Neck:     Musculoskeletal: Neck supple.  Cardiovascular:     Rate and Rhythm: Normal rate and regular rhythm.     Heart sounds: No murmur.  Pulmonary:     Effort: Pulmonary effort is normal. No respiratory distress.     Breath sounds: Normal breath sounds.  Abdominal:     Palpations: Abdomen is soft.     Tenderness: There is no abdominal tenderness.  Skin:    General: Skin is warm and dry.  Neurological:     Mental Status: She is alert and oriented to person, place, and time.  Psychiatric:  Comments: Extremely anxious      ED Treatments / Results  Labs (all labs ordered are listed, but only abnormal results are displayed) Labs Reviewed  COMPREHENSIVE METABOLIC PANEL - Abnormal; Notable for the following components:      Result Value   Glucose, Bld 108 (*)    Total Protein 8.5 (*)    All other components within normal limits  CBC - Abnormal; Notable for the following components:   RDW 11.2 (*)    All other components within normal limits  URINALYSIS, ROUTINE W REFLEX MICROSCOPIC - Abnormal; Notable for the following components:   Color, Urine COLORLESS (*)    All other components within normal limits  TSH - Abnormal; Notable for the following components:   TSH 4.771 (*)    All other components within normal limits  LIPASE, BLOOD  I-STAT BETA HCG BLOOD, ED ([facility], [facility], [facility] ONLY)    EKG None  Radiology  No results found.  Procedures Procedures (including critical care time)  Medications Ordered in ED Medications  sodium chloride flush (NS) 0.9 % injection 3 mL (has no administration in time range)     Initial Impression / Assessment and Plan / ED Course  I have reviewed the triage vital signs and the nursing notes.  Pertinent labs & imaging results that were available during my care of the patient were reviewed by me and considered in my medical decision making (see chart for details).        Patient with many symptoms, likely all stemming from anxiety.  Check labs and TSH, but anticipate discharged home with primary care follow-up.  TSH is mildly elevated, will need follow-up with her primary care doctor.  Vital signs are stable in the emergency department.  She does have a history of acid reflux, and has some burning in her epigastrium, however she only takes Tums for this.  Will prescribe her omeprazole.  Patient very nervous about COVID-19, I do not feel the patient requires testing at this time emergently.  She is afebrile, does not have cough, has no shortness of breath, and has had no direct contact with anyone with COVID-19.   Final Clinical Impressions(s) / ED Diagnoses   Final diagnoses:  Gastroesophageal reflux disease, esophagitis presence not specified  Dry mouth  Elevated TSH    ED Discharge Orders    None       Roxy Horseman, PA-C 07/08/18 0556    Dione Booze, MD 07/08/18 [phone]

## 2018-07-08 NOTE — Discharge Instructions (Addendum)
Please follow-up with your doctor.   Your thyroid test was slightly elevated today.  You will need to have this rechecked by your doctor.  No emergent condition was found to explain your symptoms.

## 2018-07-11 DIAGNOSIS — F439 Reaction to severe stress, unspecified: Secondary | ICD-10-CM | POA: Insufficient documentation

## 2018-09-25 ENCOUNTER — Other Ambulatory Visit: Payer: Self-pay | Admitting: Family Medicine

## 2018-09-25 DIAGNOSIS — Z1231 Encounter for screening mammogram for malignant neoplasm of breast: Secondary | ICD-10-CM

## 2018-10-18 ENCOUNTER — Other Ambulatory Visit: Payer: Self-pay

## 2018-10-18 ENCOUNTER — Ambulatory Visit
Admission: RE | Admit: 2018-10-18 | Discharge: 2018-10-18 | Disposition: A | Discharge status: Home / Self Care | Payer: BC Managed Care – PPO | Source: Ambulatory Visit | Attending: Family Medicine | Admitting: Family Medicine

## 2018-10-18 DIAGNOSIS — Z1231 Encounter for screening mammogram for malignant neoplasm of breast: Secondary | ICD-10-CM

## 2019-10-18 ENCOUNTER — Other Ambulatory Visit: Payer: Self-pay | Admitting: Family Medicine

## 2019-10-18 DIAGNOSIS — Z1231 Encounter for screening mammogram for malignant neoplasm of breast: Secondary | ICD-10-CM

## 2019-10-26 ENCOUNTER — Ambulatory Visit: Payer: Self-pay | Admitting: Allergy

## 2019-11-01 ENCOUNTER — Other Ambulatory Visit: Payer: Self-pay

## 2019-11-01 ENCOUNTER — Ambulatory Visit: Payer: BC Managed Care – PPO

## 2019-11-01 ENCOUNTER — Ambulatory Visit
Admission: RE | Admit: 2019-11-01 | Discharge: 2019-11-01 | Disposition: A | Discharge status: Home / Self Care | Payer: Managed Care, Other (non HMO) | Source: Ambulatory Visit | Attending: Family Medicine | Admitting: Family Medicine

## 2019-11-01 DIAGNOSIS — Z1231 Encounter for screening mammogram for malignant neoplasm of breast: Secondary | ICD-10-CM

## 2019-11-02 ENCOUNTER — Ambulatory Visit: Payer: Self-pay | Admitting: Allergy

## 2019-11-28 ENCOUNTER — Ambulatory Visit: Payer: Self-pay | Admitting: Allergy and Immunology

## 2020-12-16 ENCOUNTER — Other Ambulatory Visit: Payer: Self-pay | Admitting: Family Medicine

## 2020-12-16 DIAGNOSIS — Z1231 Encounter for screening mammogram for malignant neoplasm of breast: Secondary | ICD-10-CM

## 2021-01-28 ENCOUNTER — Ambulatory Visit
Admission: RE | Admit: 2021-01-28 | Discharge: 2021-01-28 | Disposition: A | Discharge status: Home / Self Care | Payer: BC Managed Care – PPO | Source: Ambulatory Visit | Attending: Family Medicine | Admitting: Family Medicine

## 2021-01-28 ENCOUNTER — Ambulatory Visit: Payer: Managed Care, Other (non HMO)

## 2021-01-28 DIAGNOSIS — Z1231 Encounter for screening mammogram for malignant neoplasm of breast: Secondary | ICD-10-CM

## 2021-12-08 ENCOUNTER — Other Ambulatory Visit: Payer: Self-pay | Admitting: Family Medicine

## 2021-12-08 DIAGNOSIS — Z139 Encounter for screening, unspecified: Secondary | ICD-10-CM

## 2022-01-29 ENCOUNTER — Ambulatory Visit
Admission: RE | Admit: 2022-01-29 | Discharge: 2022-01-29 | Disposition: A | Discharge status: Home / Self Care | Payer: BC Managed Care – PPO | Source: Ambulatory Visit | Attending: Family Medicine | Admitting: Family Medicine

## 2022-01-29 DIAGNOSIS — Z139 Encounter for screening, unspecified: Secondary | ICD-10-CM

## 2022-02-10 IMAGING — MG MM DIGITAL SCREENING BILAT W/ TOMO AND CAD
8 series · 9 of 24 positions shown · non-contrast
Comparison: Previous exam(s).

CLINICAL DATA: Screening.

EXAM:
DIGITAL SCREENING BILATERAL MAMMOGRAM WITH TOMOSYNTHESIS AND CAD
TECHNIQUE: Bilateral screening digital craniocaudal and mediolateral oblique
mammograms were obtained. Bilateral screening digital breast
tomosynthesis was performed. The images were evaluated with
computer-aided detection.

[L MLO synth-2D]
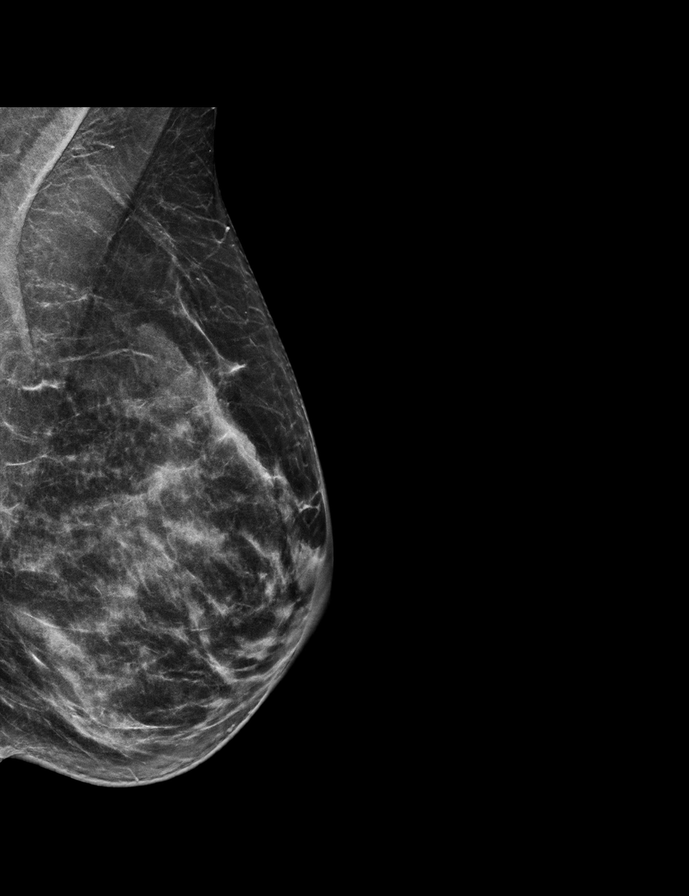

[L CC synth-2D]
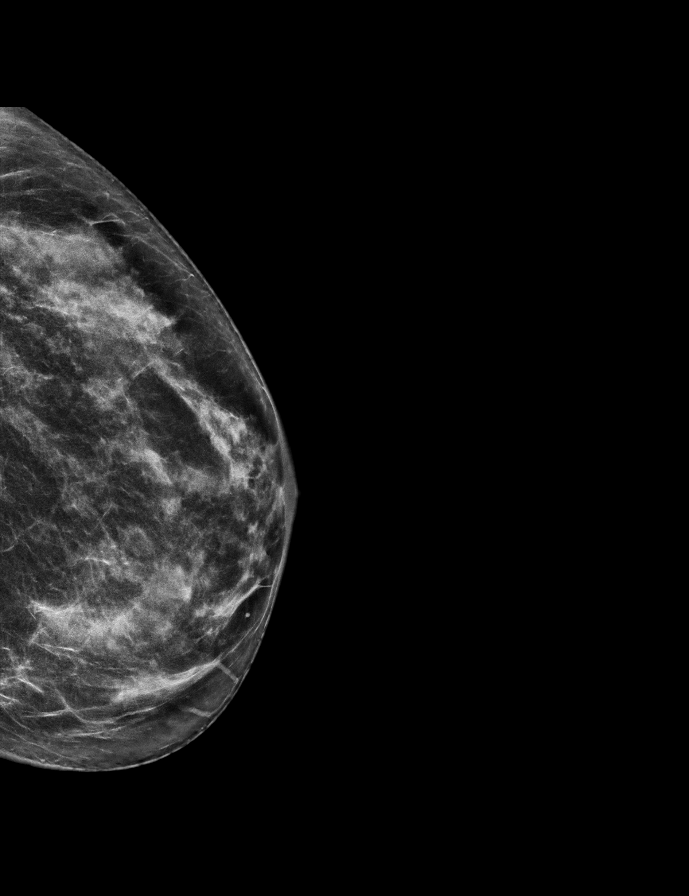

[R MLO synth-2D]
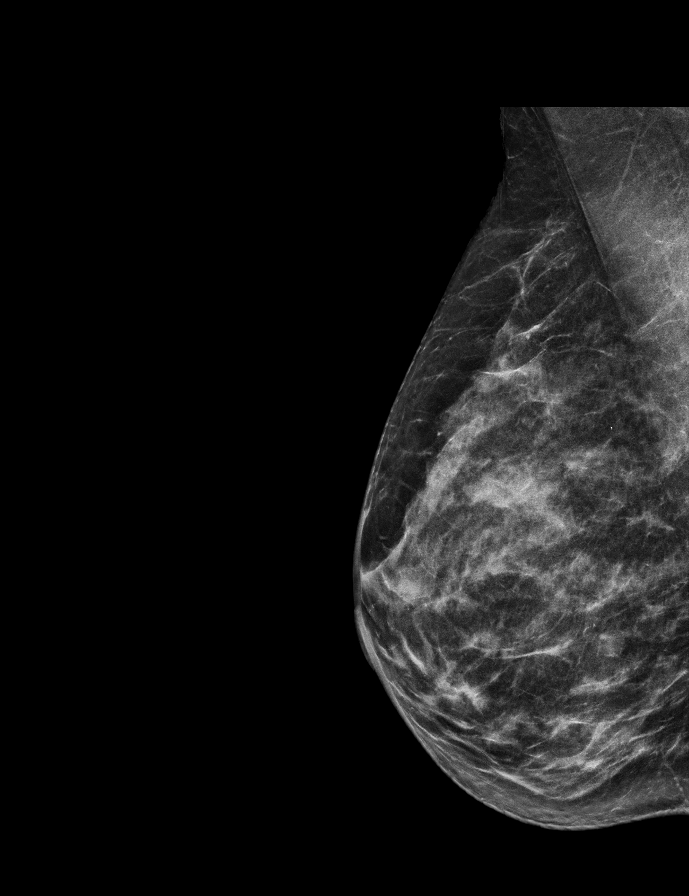

[R CC synth-2D]
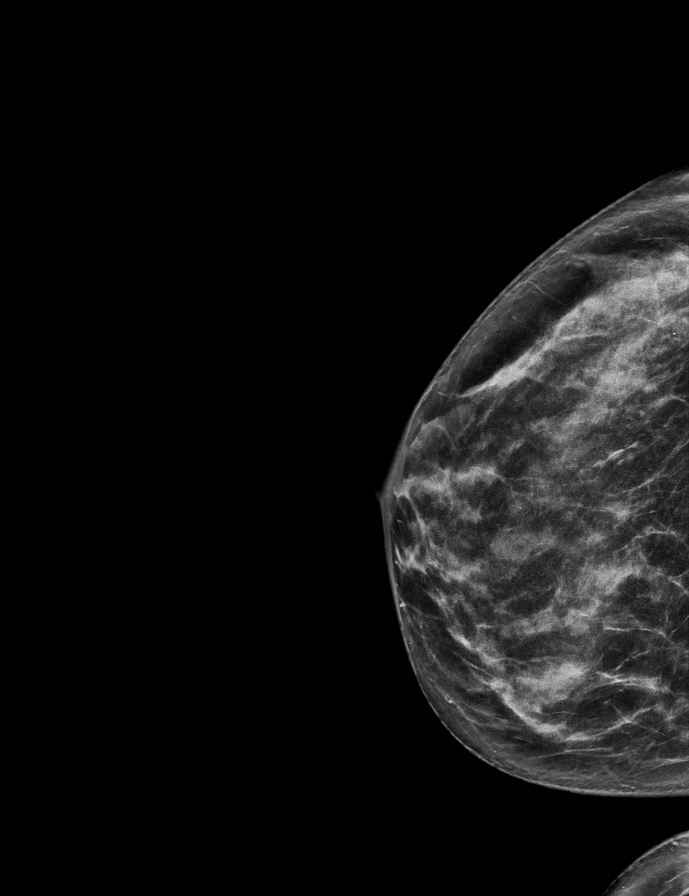

[R CC tomo · 2 of 59 frames shown]
[frame 20/59]
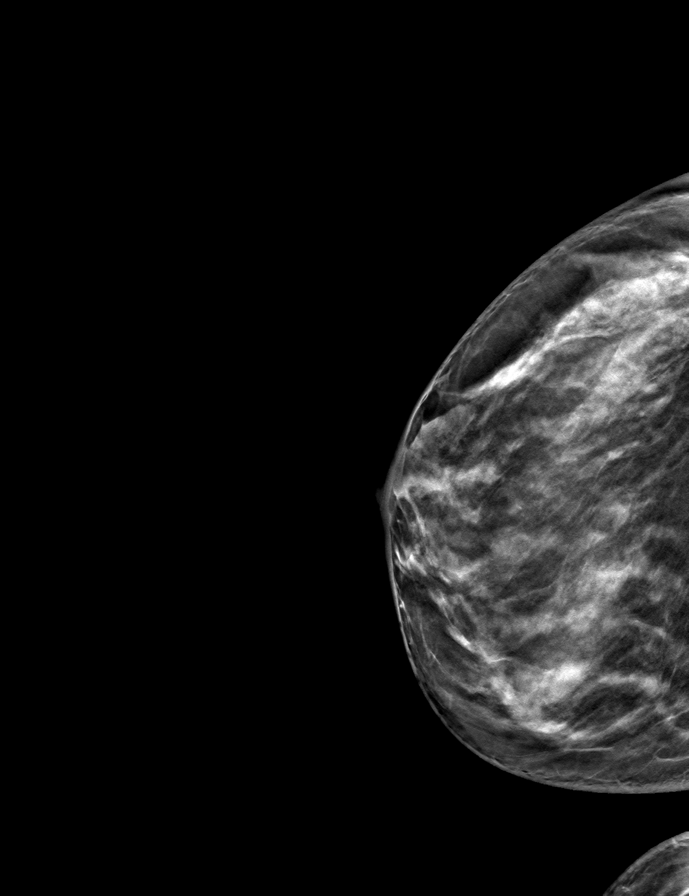
[frame 30/59]
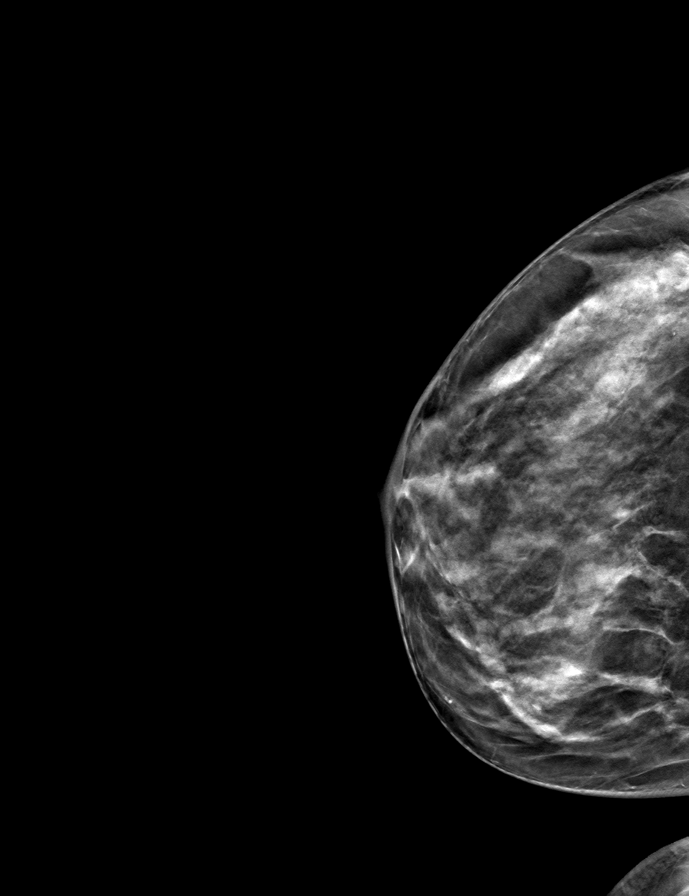

[L CC tomo · tomo slice 31/60.0]
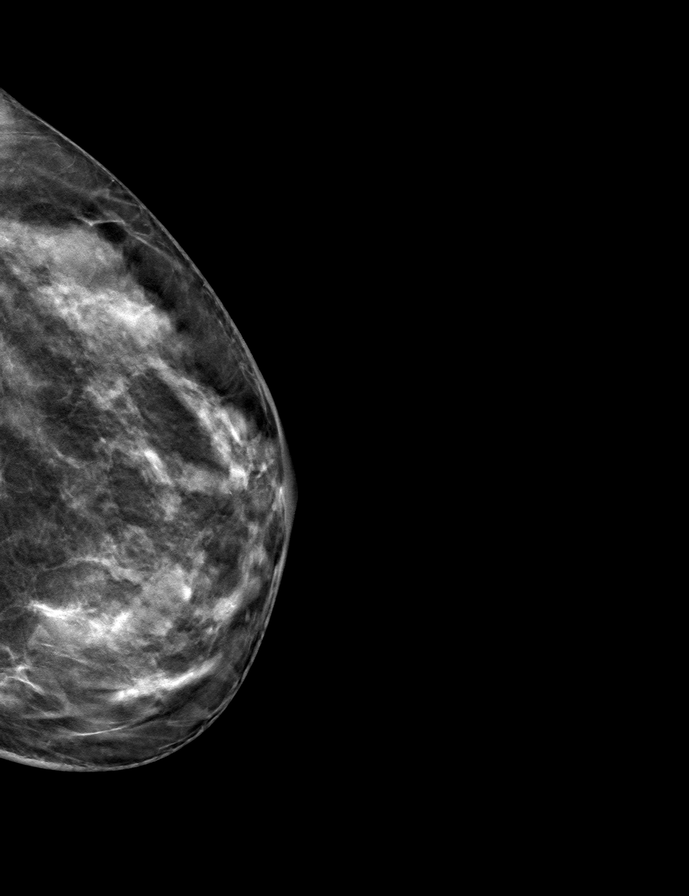

[R MLO tomo · tomo slice 29/57.0]
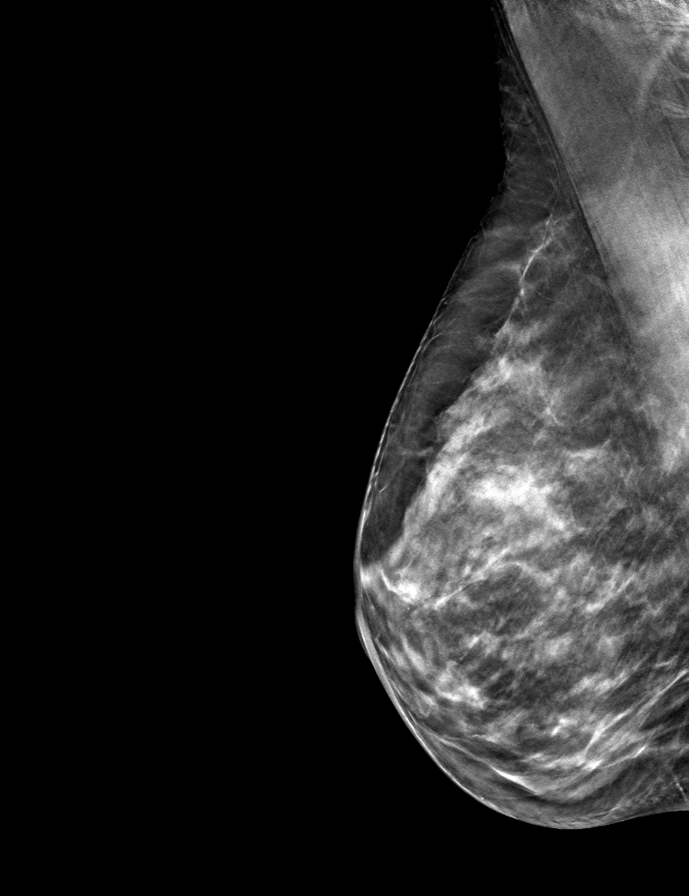

[L MLO tomo · tomo slice 31/62.0]
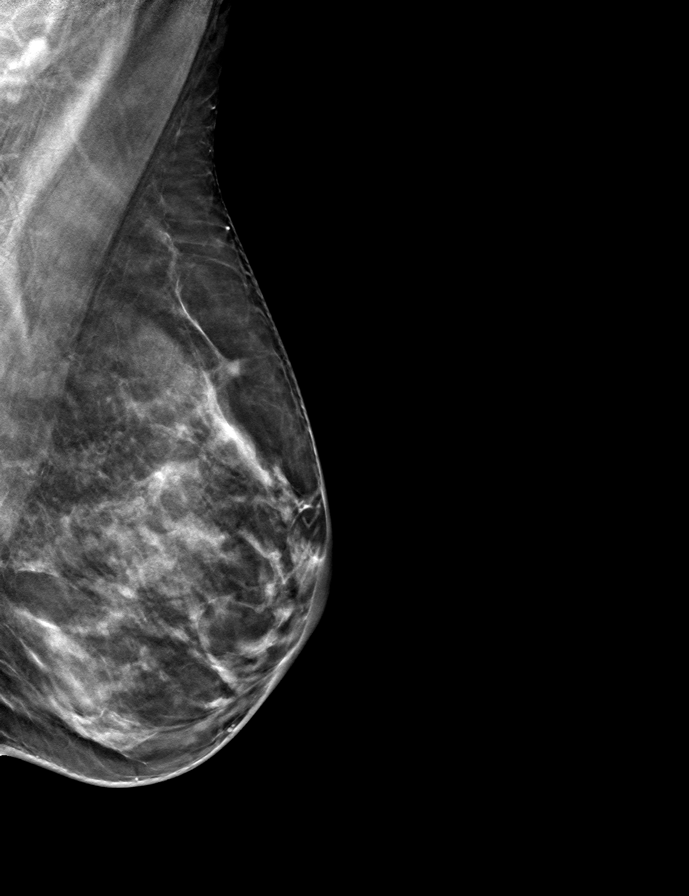

[9 of 24 positions shown; findings below may reference images not displayed]

ACR Breast Density Category c: The breast tissue is heterogeneously
dense, which may obscure small masses.
FINDINGS: There are no findings suspicious for malignancy.
IMPRESSION: No mammographic evidence of malignancy. A result letter of this
screening mammogram will be mailed directly to the patient.

RECOMMENDATION:
Screening mammogram in one year. (Code:Q3-[id number])

BI-RADS CATEGORY  1: Negative.

## 2022-06-01 LAB — HM PAP SMEAR

## 2022-09-01 ENCOUNTER — Other Ambulatory Visit (HOSPITAL_COMMUNITY): Payer: Self-pay | Admitting: Gastroenterology

## 2022-09-01 DIAGNOSIS — R1310 Dysphagia, unspecified: Secondary | ICD-10-CM

## 2023-01-26 ENCOUNTER — Other Ambulatory Visit: Payer: Self-pay | Admitting: Family Medicine

## 2023-01-26 DIAGNOSIS — Z0000 Encounter for general adult medical examination without abnormal findings: Secondary | ICD-10-CM

## 2023-03-01 ENCOUNTER — Ambulatory Visit
Admission: RE | Admit: 2023-03-01 | Discharge: 2023-03-01 | Disposition: A | Discharge status: Home / Self Care | Payer: 59 | Source: Ambulatory Visit | Attending: Family Medicine | Admitting: Family Medicine

## 2023-03-01 DIAGNOSIS — Z0000 Encounter for general adult medical examination without abnormal findings: Secondary | ICD-10-CM

## 2023-03-01 LAB — HM PAP SMEAR: HM Pap smear: NORMAL

## 2023-04-26 ENCOUNTER — Ambulatory Visit: Payer: Self-pay | Admitting: Family Medicine

## 2023-04-26 ENCOUNTER — Encounter: Payer: Self-pay | Admitting: Family Medicine

## 2023-04-26 VITALS — BP 120/80 | HR 77 | Temp 98.6°F | Ht 69.0 in | Wt 143.2 lb

## 2023-04-26 DIAGNOSIS — R0789 Other chest pain: Secondary | ICD-10-CM | POA: Diagnosis not present

## 2023-04-26 DIAGNOSIS — I10 Essential (primary) hypertension: Secondary | ICD-10-CM | POA: Diagnosis not present

## 2023-04-26 DIAGNOSIS — F411 Generalized anxiety disorder: Secondary | ICD-10-CM | POA: Insufficient documentation

## 2023-04-26 DIAGNOSIS — J309 Allergic rhinitis, unspecified: Secondary | ICD-10-CM

## 2023-04-26 DIAGNOSIS — Z23 Encounter for immunization: Secondary | ICD-10-CM | POA: Diagnosis not present

## 2023-04-26 DIAGNOSIS — Z0001 Encounter for general adult medical examination with abnormal findings: Secondary | ICD-10-CM | POA: Diagnosis not present

## 2023-04-26 DIAGNOSIS — T7840XA Allergy, unspecified, initial encounter: Secondary | ICD-10-CM | POA: Insufficient documentation

## 2023-04-26 DIAGNOSIS — K219 Gastro-esophageal reflux disease without esophagitis: Secondary | ICD-10-CM | POA: Diagnosis not present

## 2023-04-26 DIAGNOSIS — Z0000 Encounter for general adult medical examination without abnormal findings: Secondary | ICD-10-CM

## 2023-04-26 MED ORDER — AZELASTINE HCL 0.1 % NA SOLN: 1.0000 | Freq: Two times a day (BID) | NASAL | 11 refills | Status: DC

## 2023-04-26 NOTE — Progress Notes (Addendum)
 Complete physical exam  Assessment & Plan:    Routine Health Maintenance and Physical Exam Discussed health benefits of physical activity, and encouraged her to engage in regular exercise appropriate for her age and condition.  Preventative health care -     CBC -     TSH -     Lipid panel -     COMPLETE METABOLIC PANEL WITH GFR  Benign essential hypertension -     CBC -     COMPLETE METABOLIC PANEL WITH GFR  Gastroesophageal reflux disease without esophagitis -     CBC  Allergic rhinitis, unspecified seasonality, unspecified trigger -     Azelastine HCl; Place 1 spray into both nostrils 2 (two) times daily. Use in each nostril as directed  Dispense: 30 mL; Refill: 11  Need for shingles vaccine -     Varicella-zoster vaccine IM  Atypical chest pain -     EKG 12-Lead   EKG-normal sinus rhythm, no acute changes noted.  No previous EKG for comparison.  Follow-up on lab work and notify patient.  Patient will try Astelin nasal spray.  If no improvement or worsening symptoms she is to notify us.  Patient will come back in 6 months for follow-up of chronic medical issues.  #2 Shingrix vaccine given today. Return in about 6 months (around 10/27/2023), or if symptoms worsen or fail to improve, for hypertension.        Subjective:  Patient ID: Janet Perez, female    DOB: [birth year]  Age: 53 y.o. MRN: [id number] Chief Complaint  Patient presents with   Annual Exam   Establish Care    PAMI [name] is a 53 y.o. female who presents today for a complete physical exam. Mammogram- UTD, reviewed done at [facility] Colonoscopy-yes, was done 2021 per Dr. [facility], needs repeat in 10 years DEXA scan not yet Tetanus UTD Shingrix-needs 2nd one today HTN-using antihypertensive medication without difficulty.  Denies associated signs and symptoms including chest pain, shortness of breath, cough headache, peripheral swelling cramps spasms and palpitations.  Voices understanding of  the potential for interference with blood pressure control with substances including high sodium intake, decongestions, herbal supplements weight loss supplements nutritional supplements.  Blood pressures at home are less than 140/90.   Pt got off track but now will get back on track with exercise and healthy eating Allergic Rhinitis: [name] is here for evaluation of allergic rhinitis. Patient's symptoms include cough, headaches, nasal congestion, postnasal drip, sinus pressure and sneezing. These symptoms are seasonal. Current triggers include exposure to pollens. The patient has been suffering from these symptoms for approximately several months. The patient has tried over the counter medications with good relief of symptoms. Patient has been taking the Zyrtec.  Patient has not used Astelin nasal spray but would like to try this for the postnasal drip.  Patient has sinus headache on and off and does not always know if this is allergy related or she is developing a sinus infection.  No fever or chills. GERD-history of gastroesophageal reflux disease (GERD) which is well controlled with med. She reports no chronic sore throat, voice changes, hemoptysis, or hematochezia.  Atypical chest pain-patient is not sure if she pulled a muscle or not.  Patient has some chest discomfort but mostly when she stretches her arms or tries to stretch her upper torso.  Patient has not had any exertional shortness of breath or chest pain related to physical activity or exercise.  Patient does have GERD but thinks this is different. The 10-year ASCVD risk score (Arnett DK, et al., 2019) is: 1.4%   Values used to calculate the score:     Age: 53 years     Sex: Female     Is Non-Hispanic African American: Yes     Diabetic: No     Tobacco smoker: No     Systolic Blood Pressure: 106 mmHg     Is BP treated: Yes     HDL Cholesterol: 64 mg/dL     Total Cholesterol: 192 mg/dL  Health Maintenance  Topic Date Due   HIV  Screening  Never done   Hepatitis C Screening  Never done   COVID-19 Vaccine (5 - 2024-25 season) 05/12/2023*   DTaP/Tdap/Td vaccine (4 - Td or Tdap) 07/21/2024   Mammogram  02/28/2025   Pap with HPV screening  02/28/2026   Colon Cancer Screening  04/24/2029   Flu Shot  Completed   Zoster (Shingles) Vaccine  Completed   HPV Vaccine  Aged Out  *Topic was postponed. The date shown is not the original due date.    Most recent fall risk assessment:    04/26/2023    8:16 AM  Fall Risk   Falls in the past year? 0  Number falls in past yr: 0  Injury with Fall? 0     Most recent depression screenings:    04/26/2023    8:16 AM  PHQ 2/9 Scores  PHQ - 2 Score 0  PHQ- 9 Score 0      The 10-year ASCVD risk score (Arnett DK, et al., 2019) is: 2.3%   Values used to calculate the score:     Age: 53 years     Sex: Female     Is Non-Hispanic African American: Yes     Diabetic: No     Tobacco smoker: No     Systolic Blood Pressure: 120 mmHg     Is BP treated: Yes     HDL Cholesterol: 60 mg/dL     Total Cholesterol: 181 mg/dL  History reviewed. No pertinent surgical history. Social History   Tobacco Use   Smoking status: Never   Smokeless tobacco: Never  Vaping Use   Vaping status: Never Used  Substance Use Topics   Alcohol use: No   Drug use: No   Social History   Socioeconomic History   Marital status: Single    Spouse name: Not on file   Number of children: Not on file   Years of education: Not on file   Highest education level: Bachelor's degree (e.g., BA, AB, BS)  Occupational History   Not on file  Tobacco Use   Smoking status: Never   Smokeless tobacco: Never  Vaping Use   Vaping status: Never Used  Substance and Sexual Activity   Alcohol use: No   Drug use: No   Sexual activity: Not on file  Other Topics Concern   Not on file  Social History Narrative   Patient lives in [place].    Social Drivers of Corporate investment banker Strain: Low  Risk  (04/22/2023)   Overall Financial Resource Strain (CARDIA)    Difficulty of Paying Living Expenses: Not very hard  Food Insecurity: No Food Insecurity (04/22/2023)   Hunger Vital Sign    Worried About Running Out of Food in the Last Year: Never true    Ran Out of Food in the Last Year: Never true  Transportation  Needs: No Transportation Needs (04/22/2023)   PRAPARE - Administrator, Civil Service (Medical): No    Lack of Transportation (Non-Medical): No  Physical Activity: Insufficiently Active (04/22/2023)   Exercise Vital Sign    Days of Exercise per Week: 2 days    Minutes of Exercise per Session: 10 min  Stress: No Stress Concern Present (04/22/2023)   [employer] of Occupational Health - Occupational Stress Questionnaire    Feeling of Stress : Only a little  Social Connections: Moderately Integrated (04/22/2023)   Social Connection and Isolation Panel [NHANES]    Frequency of Communication with Friends and Family: More than three times a week    Frequency of Social Gatherings with Friends and Family: More than three times a week    Attends Religious Services: More than 4 times per year    Active Member of [employer] or Organizations: Yes    Attends Banker Meetings: More than 4 times per year    Marital Status: Never married  Intimate Partner Violence: Unknown (05/18/2021)   Received from [employer] [facility], [facility]   HITS    Physically Hurt: Not on file    Insult or Talk Down To: Not on file    Threaten Physical Harm: Not on file    Scream or Curse: Not on file   Family History  Problem Relation Age of Onset   Hypertension Mother    Lung cancer Father    Breast cancer Maternal Aunt    Uterine cancer Maternal Aunt    Colon cancer Neg Hx    Allergies  Allergen Reactions   Amoxicillin-Pot Clavulanate Diarrhea     Patient Care Team: Bernadette Hoit, MD as PCP - General (Family Medicine)   Outpatient Medications Prior to Visit  Medication Sig    acetaminophen (TYLENOL) 500 MG tablet Take 500 mg by mouth every 6 (six) hours as needed for moderate pain.   ALPRAZolam (XANAX) 0.5 MG tablet Take 0.5 mg by mouth at bedtime as needed for anxiety.   cetirizine (ZYRTEC) 10 MG tablet Take 10 mg by mouth daily.   fluticasone (FLONASE) 50 MCG/ACT nasal spray Place 1 spray into both nostrils daily as needed for allergies or rhinitis.   Multiple Vitamin (MULTIVITAMIN WITH MINERALS) TABS tablet Take 1 tablet by mouth daily.   olmesartan-hydrochlorothiazide (BENICAR HCT) 20-12.5 MG per tablet Take 1 tablet by mouth daily.    omeprazole (PRILOSEC) 20 MG capsule Take 1 capsule (20 mg total) by mouth daily.   [DISCONTINUED] cyclobenzaprine (FLEXERIL) 5 MG tablet Take 1 tablet (5 mg total) by mouth 2 (two) times daily as needed for muscle spasms. (Patient not taking: Reported on 07/08/2018)   No facility-administered medications prior to visit.    ROS     Objective:    BP 120/80   Pulse 77   Temp 98.6 F (37 C)   Ht 5\' 9"  (1.753 m)   Wt 143 lb 4 oz (65 kg)   LMP 11/29/2013   SpO2 99%   BMI 21.15 kg/m  BP Readings from Last 3 Encounters:  04/26/23 120/80  07/08/18 116/84  12/19/13 109/61   Wt Readings from Last 3 Encounters:  04/26/23 143 lb 4 oz (65 kg)  07/08/18 125 lb (56.7 kg)  12/02/13 119 lb (54 kg)    Physical Exam Vitals and nursing note reviewed.  Constitutional:      General: She is not in acute distress.    Appearance: Normal appearance.  HENT:  Head: Normocephalic.     Right Ear: Tympanic membrane, ear canal and external ear normal.     Left Ear: Tympanic membrane, ear canal and external ear normal.     Nose: Nose normal.     Mouth/Throat:     Mouth: Mucous membranes are moist.     Pharynx: Oropharynx is clear.     Comments: Postnasal drip Eyes:     Extraocular Movements: Extraocular movements intact.     Conjunctiva/sclera: Conjunctivae normal.     Pupils: Pupils are equal, round, and reactive to light.   Neck:     Thyroid: No thyroid mass, thyromegaly or thyroid tenderness.  Cardiovascular:     Rate and Rhythm: Normal rate and regular rhythm.     Heart sounds: Normal heart sounds.  Pulmonary:     Effort: Pulmonary effort is normal.     Breath sounds: Normal breath sounds.  Chest:  Breasts:    Breasts are symmetrical.     Right: Normal. No inverted nipple, mass, nipple discharge or tenderness.     Left: Normal. No inverted nipple, mass, nipple discharge or tenderness.  Abdominal:     General: Bowel sounds are normal.     Palpations: Abdomen is soft.     Tenderness: There is no abdominal tenderness. There is no guarding.  Musculoskeletal:        General: No tenderness. Normal range of motion.     Cervical back: Normal range of motion and neck supple.     Right lower leg: No edema.     Left lower leg: No edema.  Lymphadenopathy:     Upper Body:     Right upper body: No supraclavicular or axillary adenopathy.     Left upper body: No supraclavicular or axillary adenopathy.  Skin:    General: Skin is warm and dry.     Findings: Lesion present.     Comments: Patient has a darkly pigmented well circumscribed lesion over the right breast-patient does not think it has not changed since last year.  Neurological:     General: No focal deficit present.     Mental Status: She is alert and oriented to person, place, and time. Mental status is at baseline.     Cranial Nerves: No cranial nerve deficit.     Sensory: No sensory deficit.     Motor: No weakness.     Coordination: Coordination normal.     Gait: Gait normal.     Deep Tendon Reflexes: Reflexes normal.  Psychiatric:        Mood and Affect: Mood normal.        Behavior: Behavior normal.        Thought Content: Thought content normal.        Judgment: Judgment normal.      Results for orders placed or performed in visit on 04/26/23  HM PAP SMEAR  Result Value Ref Range   HM Pap smear NORMAL         Bernadette Hoit,  MD

## 2023-04-27 ENCOUNTER — Encounter: Payer: Self-pay | Admitting: Family Medicine

## 2023-04-27 LAB — COMPLETE METABOLIC PANEL WITH GFR
AG Ratio: 1.4 (calc) (ref 1.0–2.5)
ALT: 13 U/L (ref 6–29)
AST: 16 U/L (ref 10–35)
Albumin: 4.5 g/dL (ref 3.6–5.1)
Alkaline phosphatase (APISO): 88 U/L (ref 37–153)
BUN: 14 mg/dL (ref 7–25)
CO2: 30 mmol/L (ref 20–32)
Calcium: 9.7 mg/dL (ref 8.6–10.4)
Chloride: 103 mmol/L (ref 98–110)
Creat: 0.76 mg/dL (ref 0.50–1.03)
Globulin: 3.2 g/dL (ref 1.9–3.7)
Glucose, Bld: 94 mg/dL (ref 65–99)
Potassium: 4.1 mmol/L (ref 3.5–5.3)
Sodium: 142 mmol/L (ref 135–146)
Total Bilirubin: 0.4 mg/dL (ref 0.2–1.2)
Total Protein: 7.7 g/dL (ref 6.1–8.1)
eGFR: 95 mL/min/{1.73_m2} (ref >=60)

## 2023-04-27 LAB — LIPID PANEL
Cholesterol: 192 mg/dL (ref <200)
HDL: 64 mg/dL (ref >=50)
LDL Cholesterol (Calc): 113 mg/dL — ABNORMAL HIGH
Non-HDL Cholesterol (Calc): 128 mg/dL (ref <130)
Total CHOL/HDL Ratio: 3 (calc) (ref <5.0)
Triglycerides: 68 mg/dL (ref <150)

## 2023-04-27 LAB — TSH: TSH: 3.89 m[IU]/L

## 2023-04-27 LAB — CBC
HCT: 37.8 % (ref 35.0–45.0)
Hemoglobin: 12.6 g/dL (ref 11.7–15.5)
MCH: 30.1 pg (ref 27.0–33.0)
MCHC: 33.3 g/dL (ref 32.0–36.0)
MCV: 90.4 fL (ref 80.0–100.0)
MPV: 10.7 fL (ref 7.5–12.5)
Platelets: 300 10*3/uL (ref 140–400)
RBC: 4.18 10*6/uL (ref 3.80–5.10)
RDW: 12.3 % (ref 11.0–15.0)
WBC: 4.6 10*3/uL (ref 3.8–10.8)

## 2023-10-05 ENCOUNTER — Other Ambulatory Visit: Payer: Self-pay

## 2023-10-05 ENCOUNTER — Telehealth: Payer: Self-pay

## 2023-10-05 DIAGNOSIS — I10 Essential (primary) hypertension: Secondary | ICD-10-CM

## 2023-10-05 MED ORDER — OLMESARTAN MEDOXOMIL-HCTZ 20-12.5 MG PO TABS: 1.0000 | ORAL_TABLET | Freq: Every day | ORAL | 1 refills | Status: AC

## 2023-10-05 NOTE — Telephone Encounter (Signed)
 Copied from CRM [id number]. Topic: Clinical - Prescription Issue >> Oct 05, 2023  3:19 PM Pinkey ORN wrote: Reason for CRM: olmesartan -hydrochlorothiazide (BENICAR  HCT) 20-12.5 MG per tablet >> Oct 05, 2023  3:23 PM Pinkey ORN wrote: Patient states her medication came back denied when she tried to get it refilled at the [facility].

## 2023-11-01 ENCOUNTER — Encounter: Payer: Self-pay | Admitting: Family Medicine

## 2023-11-01 ENCOUNTER — Ambulatory Visit: Admitting: Family Medicine

## 2023-11-01 VITALS — BP 118/80 | HR 107 | Temp 98.2°F | Ht 69.0 in | Wt 145.1 lb

## 2023-11-01 DIAGNOSIS — I10 Essential (primary) hypertension: Secondary | ICD-10-CM

## 2023-11-01 DIAGNOSIS — Z23 Encounter for immunization: Secondary | ICD-10-CM | POA: Diagnosis not present

## 2023-11-01 DIAGNOSIS — F411 Generalized anxiety disorder: Secondary | ICD-10-CM | POA: Diagnosis not present

## 2023-11-01 DIAGNOSIS — J309 Allergic rhinitis, unspecified: Secondary | ICD-10-CM | POA: Diagnosis not present

## 2023-11-01 MED ORDER — FLUTICASONE PROPIONATE 50 MCG/ACT NA SUSP: 1.0000 | Freq: Every day | NASAL | 11 refills | Status: AC | PRN

## 2023-11-01 MED ORDER — MONTELUKAST SODIUM 10 MG PO TABS: 10.0000 mg | ORAL_TABLET | Freq: Every day | ORAL | 3 refills | Status: AC

## 2023-11-01 NOTE — Progress Notes (Signed)
 Patient Office Visit  Assessment & Plan:  Benign essential hypertension -     CBC with Differential/Platelet -     Comprehensive metabolic panel with GFR -     Lipid panel  Immunization due -     Flu vaccine trivalent PF, 6mos and older(Flulaval,Afluria,Fluarix,Fluzone)  Generalized anxiety disorder  Allergic rhinitis, unspecified seasonality, unspecified trigger -     Fluticasone  Propionate; Place 1 spray into both nostrils daily as needed for allergies or rhinitis.  Dispense: 16 g; Refill: 11 -     Montelukast  Sodium; Take 1 tablet (10 mg total) by mouth at bedtime.  Dispense: 90 tablet; Refill: 3   Assessment and Plan    Allergic rhinitis Chronic allergic rhinitis with significant postnasal drip and hoarseness, severe in fall. Current regimen includes Zyrtec and Flonase , considering alternatives due to persistent symptoms. - Prescribe Singulair  at night. - Recommend over-the-counter Allegra instead of Zyrtec to assess efficacy. - Continue Flonase  as preferred nasal spray. - Advise against Allegra D due to potential side effects.  Essential hypertension Blood pressure well-controlled with current medication. - Continue current blood pressure medication regimen.     Recommend healthy diet i.e mediterranean/DASH diet, consistent exercise - 30 minutes 5 day per week, and gradual weight loss. Return for complete physical.  If her sinus issues do not improve she can send us  a message via MyChart.  Tell patient I do not think she needed antibiotic at this time.  Patient will use the Allegra, Flonase  and add Singulair  to see if it helps with her allergies.  Return if symptoms worsen or fail to improve, for physical.   Subjective:    Patient ID: Janet Perez, female    DOB: 1970/05/28  Age: 53 y.o. MRN: [id number]  Chief Complaint  Patient presents with   Medical Management of Chronic Issues    HPI Discussed the use of AI scribe software for clinical note transcription  with the patient, who gave verbal consent to proceed.  History of Present Illness        Janet Perez is a 53 year old female who presents for a six-month follow-up visit.  She experiences chronic allergic rhinitis, particularly bothersome in the fall, with symptoms including postnasal drip, headaches, hoarseness, ear bubbling, and nasal congestion. She uses over-the-counter Zyrtec and Flonase  nasal spray but finds Zyrtec less effective than before. She has previously used Allegra and Singulair , which were helpful. She prefers Flonase  over Astelin  due to its unpleasant taste and side effects.  Her dietary habits have changed since moving back to the area, with an increase in fried food consumption, although she tries to balance it with healthier options like grilled chicken and salads. She enjoys cooking dishes such as broccoli chicken casserole and spaghetti, often adding fried chicken to her salads.  She works at [employer] and has been there for four years. She has recently moved back to the area and has been eating out more frequently. Physical Exam CHEST: Lungs clear to auscultation bilaterally Results RADIOLOGY Mammogram: Completed (02/2023)  DIAGNOSTIC Colonoscopy: Completed, on ten-year follow-up plan (2021) Assessment & Plan Allergic rhinitis Chronic allergic rhinitis with significant postnasal drip and hoarseness, severe in fall. Current regimen includes Zyrtec and Flonase , considering alternatives due to persistent symptoms. - Prescribe Singulair  at night. - Recommend over-the-counter Allegra instead of Zyrtec to assess efficacy. - Continue Flonase  as preferred nasal spray. - Advise against Allegra D due to potential side effects.  Essential hypertension Blood pressure well-controlled with current  medication. - Continue current blood pressure medication regimen.    The 10-year ASCVD risk score (Arnett DK, et al., 2019) is: 2.3%  Past Medical History:  Diagnosis  Date   Anxiety    Hypertension    Panic attack    History reviewed. No pertinent surgical history. Social History   Tobacco Use   Smoking status: Never   Smokeless tobacco: Never  Vaping Use   Vaping status: Never Used  Substance Use Topics   Alcohol use: No   Drug use: No   Family History  Problem Relation Age of Onset   Hypertension Mother    Lung cancer Father    Breast cancer Maternal Aunt    Uterine cancer Maternal Aunt    Colon cancer Neg Hx    Allergies  Allergen Reactions   Amoxicillin-Pot Clavulanate Diarrhea    ROS    Objective:    BP 118/80   Pulse (!) 107   Temp 98.2 F (36.8 C)   Ht 5' 9 (1.753 m)   Wt 145 lb 2 oz (65.8 kg)   LMP 11/29/2013   SpO2 99%   BMI 21.43 kg/m  BP Readings from Last 3 Encounters:  11/01/23 118/80  04/26/23 120/80  07/08/18 116/84   Wt Readings from Last 3 Encounters:  11/01/23 145 lb 2 oz (65.8 kg)  04/26/23 143 lb 4 oz (65 kg)  07/08/18 125 lb (56.7 kg)    Physical Exam Vitals and nursing note reviewed.  Constitutional:      General: She is not in acute distress.    Appearance: Normal appearance.  HENT:     Head: Normocephalic.     Right Ear: Tympanic membrane, ear canal and external ear normal.     Left Ear: Tympanic membrane, ear canal and external ear normal.     Nose: Congestion present.     Mouth/Throat:     Pharynx: No oropharyngeal exudate or posterior oropharyngeal erythema.  Eyes:     Extraocular Movements: Extraocular movements intact.     Conjunctiva/sclera: Conjunctivae normal.     Pupils: Pupils are equal, round, and reactive to light.  Cardiovascular:     Rate and Rhythm: Normal rate and regular rhythm.     Heart sounds: Normal heart sounds.  Pulmonary:     Effort: Pulmonary effort is normal.     Breath sounds: Normal breath sounds. No wheezing or rhonchi.  Musculoskeletal:     Right lower leg: No edema.     Left lower leg: No edema.  Neurological:     General: No focal deficit  present.     Mental Status: She is alert and oriented to person, place, and time.  Psychiatric:        Mood and Affect: Mood normal.        Behavior: Behavior normal.      No results found for any visits on 11/01/23.

## 2023-11-02 ENCOUNTER — Ambulatory Visit: Payer: Self-pay | Admitting: Family Medicine

## 2023-11-02 LAB — COMPREHENSIVE METABOLIC PANEL WITH GFR
AG Ratio: 1.5 (calc) (ref 1.0–2.5)
ALT: 11 U/L (ref 6–29)
AST: 17 U/L (ref 10–35)
Albumin: 4.6 g/dL (ref 3.6–5.1)
Alkaline phosphatase (APISO): 88 U/L (ref 37–153)
BUN: 12 mg/dL (ref 7–25)
CO2: 27 mmol/L (ref 20–32)
Calcium: 9.6 mg/dL (ref 8.6–10.4)
Chloride: 103 mmol/L (ref 98–110)
Creat: 0.68 mg/dL (ref 0.50–1.03)
Globulin: 3 g/dL (ref 1.9–3.7)
Glucose, Bld: 86 mg/dL (ref 65–99)
Potassium: 3.7 mmol/L (ref 3.5–5.3)
Sodium: 141 mmol/L (ref 135–146)
Total Bilirubin: 0.4 mg/dL (ref 0.2–1.2)
Total Protein: 7.6 g/dL (ref 6.1–8.1)
eGFR: 105 mL/min/1.73m2 (ref >=60)

## 2023-11-02 LAB — CBC WITH DIFFERENTIAL/PLATELET
Absolute Lymphocytes: 2764 {cells}/uL (ref 850–3900)
Absolute Monocytes: 388 {cells}/uL (ref 200–950)
Basophils Absolute: 41 {cells}/uL (ref 0–200)
Basophils Relative: 0.8 %
Eosinophils Absolute: 117 {cells}/uL (ref 15–500)
Eosinophils Relative: 2.3 %
HCT: 36.8 % (ref 35.0–45.0)
Hemoglobin: 12.2 g/dL (ref 11.7–15.5)
MCH: 29.9 pg (ref 27.0–33.0)
MCHC: 33.2 g/dL (ref 32.0–36.0)
MCV: 90.2 fL (ref 80.0–100.0)
MPV: 10.9 fL (ref 7.5–12.5)
Monocytes Relative: 7.6 %
Neutro Abs: 1790 {cells}/uL (ref 1500–7800)
Neutrophils Relative %: 35.1 %
Platelets: 281 Thousand/uL (ref 140–400)
RBC: 4.08 Million/uL (ref 3.80–5.10)
RDW: 12.5 % (ref 11.0–15.0)
Total Lymphocyte: 54.2 %
WBC: 5.1 Thousand/uL (ref 3.8–10.8)

## 2023-11-02 LAB — LIPID PANEL
Cholesterol: 187 mg/dL (ref <200)
HDL: 68 mg/dL (ref >=50)
LDL Cholesterol (Calc): 104 mg/dL — ABNORMAL HIGH
Non-HDL Cholesterol (Calc): 119 mg/dL (ref <130)
Total CHOL/HDL Ratio: 2.8 (calc) (ref <5.0)
Triglycerides: 60 mg/dL (ref <150)

## 2023-11-14 ENCOUNTER — Ambulatory Visit (INDEPENDENT_AMBULATORY_CARE_PROVIDER_SITE_OTHER)

## 2023-11-14 DIAGNOSIS — Z23 Encounter for immunization: Secondary | ICD-10-CM | POA: Diagnosis not present

## 2024-01-27 ENCOUNTER — Other Ambulatory Visit: Payer: Self-pay | Admitting: Family Medicine

## 2024-01-27 DIAGNOSIS — Z1231 Encounter for screening mammogram for malignant neoplasm of breast: Secondary | ICD-10-CM

## 2024-03-01 ENCOUNTER — Ambulatory Visit
Admission: RE | Admit: 2024-03-01 | Discharge: 2024-03-01 | Disposition: A | Source: Ambulatory Visit | Attending: Family Medicine | Admitting: Family Medicine

## 2024-03-01 DIAGNOSIS — Z1231 Encounter for screening mammogram for malignant neoplasm of breast: Secondary | ICD-10-CM

## 2024-04-27 ENCOUNTER — Ambulatory Visit: Admitting: Family Medicine

## 2024-06-13 ENCOUNTER — Encounter: Admitting: Family Medicine
